# Patient Record
Sex: Male | Born: 1965 | Race: White | Hispanic: No | Marital: Single | Smoking: Former smoker
Health system: Southern US, Community
[De-identification: ages and names within clinical notes are randomized; demographics above are authoritative.]

## PROBLEM LIST (undated history)

## (undated) DIAGNOSIS — M1990 Unspecified osteoarthritis, unspecified site: Secondary | ICD-10-CM

## (undated) DIAGNOSIS — I10 Essential (primary) hypertension: Secondary | ICD-10-CM

## (undated) HISTORY — PX: EYE SURGERY: SHX253

## (undated) HISTORY — PX: VASECTOMY: SHX75

## (undated) HISTORY — PX: ANKLE FRACTURE SURGERY: SHX122

## (undated) HISTORY — PX: ORBITAL FRACTURE SURGERY: SHX725

## (undated) HISTORY — PX: FRACTURE SURGERY: SHX138

---

## 2013-10-11 ENCOUNTER — Emergency Department: Payer: Self-pay | Admitting: Emergency Medicine

## 2013-10-16 ENCOUNTER — Emergency Department: Payer: Self-pay | Admitting: Emergency Medicine

## 2013-10-20 ENCOUNTER — Emergency Department: Payer: Self-pay | Admitting: Emergency Medicine

## 2014-06-08 HISTORY — PX: FACIAL FRACTURE SURGERY: SHX1570
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
## 2019-08-09 NOTE — Anesthesia Preprocedure Evaluation (Addendum)
Anesthesia Evaluation  Patient identified by MRN, date of birth, ID band Patient awake    Reviewed: Allergy & Precautions, NPO status , Patient's Chart, lab work & pertinent test results  History of Anesthesia Complications Negative for: history of anesthetic complications  Airway Mallampati: II  TM Distance: >3 FB Neck ROM: Full  Mouth opening: Limited Mouth Opening  Dental   Pulmonary former smoker,    breath sounds clear to auscultation       Cardiovascular hypertension, (-) angina(-) DOE  Rhythm:Regular Rate:Normal     Neuro/Psych    GI/Hepatic neg GERD  ,  Endo/Other    Renal/GU      Musculoskeletal   Abdominal   Peds  Hematology   Anesthesia Other Findings   Reproductive/Obstetrics                            Anesthesia Physical Anesthesia Plan  ASA: II  Anesthesia Plan: MAC   Post-op Pain Management:    Induction: Intravenous  PONV Risk Score and Plan: 1 and TIVA, Midazolam and Treatment may vary due to age or medical condition  Airway Management Planned: Nasal Cannula  Additional Equipment:   Intra-op Plan:   Post-operative Plan:   Informed Consent: I have reviewed the patients History and Physical, chart, labs and discussed the procedure including the risks, benefits and alternatives for the proposed anesthesia with the patient or authorized representative who has indicated his/her understanding and acceptance.       Plan Discussed with: CRNA and Anesthesiologist  Anesthesia Plan Comments:         Anesthesia Quick Evaluation

## 2019-08-14 ENCOUNTER — Other Ambulatory Visit
Admission: RE | Admit: 2019-08-14 | Discharge: 2019-08-14 | Disposition: A | Discharge status: Home / Self Care | Payer: HRSA Program | Source: Ambulatory Visit | Attending: Ophthalmology | Admitting: Ophthalmology

## 2019-08-14 ENCOUNTER — Encounter: Payer: Self-pay | Admitting: Ophthalmology

## 2019-08-14 ENCOUNTER — Other Ambulatory Visit: Payer: Self-pay

## 2019-08-14 DIAGNOSIS — Z20822 Contact with and (suspected) exposure to covid-19: Secondary | ICD-10-CM | POA: Diagnosis not present

## 2019-08-14 DIAGNOSIS — Z01812 Encounter for preprocedural laboratory examination: Secondary | ICD-10-CM | POA: Diagnosis present

## 2019-08-14 LAB — SARS CORONAVIRUS 2 (TAT 6-24 HRS): SARS Coronavirus 2: NEGATIVE

## 2019-08-14 NOTE — Discharge Instructions (Signed)

## 2019-08-16 ENCOUNTER — Ambulatory Visit: Payer: Self-pay | Admitting: Anesthesiology

## 2019-08-16 ENCOUNTER — Encounter: Payer: Self-pay | Admitting: Ophthalmology

## 2019-08-16 ENCOUNTER — Ambulatory Visit
Admission: RE | Admit: 2019-08-16 | Discharge: 2019-08-16 | Disposition: A | Discharge status: Home / Self Care | Payer: Self-pay | Attending: Ophthalmology | Admitting: Ophthalmology

## 2019-08-16 ENCOUNTER — Encounter
Admission: RE | Disposition: A | Discharge status: Home / Self Care | Payer: Self-pay | Source: Home / Self Care | Attending: Ophthalmology

## 2019-08-16 ENCOUNTER — Other Ambulatory Visit: Payer: Self-pay

## 2019-08-16 DIAGNOSIS — H2511 Age-related nuclear cataract, right eye: Secondary | ICD-10-CM | POA: Insufficient documentation

## 2019-08-16 DIAGNOSIS — Z87891 Personal history of nicotine dependence: Secondary | ICD-10-CM | POA: Insufficient documentation

## 2019-08-16 DIAGNOSIS — I10 Essential (primary) hypertension: Secondary | ICD-10-CM | POA: Insufficient documentation

## 2019-08-16 HISTORY — DX: Essential (primary) hypertension: I10

## 2019-08-16 HISTORY — DX: Unspecified osteoarthritis, unspecified site: M19.90

## 2019-08-16 HISTORY — PX: CATARACT EXTRACTION W/PHACO: SHX586

## 2019-08-16 SURGERY — PHACOEMULSIFICATION, CATARACT, WITH IOL INSERTION
Anesthesia: Monitor Anesthesia Care | Site: Eye | Laterality: Right

## 2019-08-16 MED ORDER — MIDAZOLAM HCL 2 MG/2ML IJ SOLN
INTRAMUSCULAR | Status: DC | PRN
  Administered 2019-08-16 (×2): 1 mg via INTRAVENOUS

## 2019-08-16 MED ORDER — FENTANYL CITRATE (PF) 100 MCG/2ML IJ SOLN
INTRAMUSCULAR | Status: DC | PRN
  Administered 2019-08-16: 50 ug via INTRAVENOUS

## 2019-08-16 MED ORDER — ACETAMINOPHEN 10 MG/ML IV SOLN: 1000.0000 mg | Freq: Once | INTRAVENOUS | Status: DC | PRN

## 2019-08-16 MED ORDER — BRIMONIDINE TARTRATE-TIMOLOL 0.2-0.5 % OP SOLN
OPHTHALMIC | Status: DC | PRN
  Administered 2019-08-16: 1 [drp] via OPHTHALMIC

## 2019-08-16 MED ORDER — MOXIFLOXACIN HCL 0.5 % OP SOLN
1.0000 [drp] | OPHTHALMIC | Status: DC | PRN
  Administered 2019-08-16 (×3): 1 [drp] via OPHTHALMIC

## 2019-08-16 MED ORDER — ARMC OPHTHALMIC DILATING DROPS
1.0000 "application " | OPHTHALMIC | Status: DC | PRN
  Administered 2019-08-16 (×3): 1 via OPHTHALMIC

## 2019-08-16 MED ORDER — LIDOCAINE HCL (PF) 2 % IJ SOLN
INTRAOCULAR | Status: DC | PRN
  Administered 2019-08-16: 2 mL

## 2019-08-16 MED ORDER — ONDANSETRON HCL 4 MG/2ML IJ SOLN: 4.0000 mg | Freq: Once | INTRAMUSCULAR | Status: DC | PRN

## 2019-08-16 MED ORDER — EPINEPHRINE PF 1 MG/ML IJ SOLN
INTRAOCULAR | Status: DC | PRN
  Administered 2019-08-16: 10:00:00 43 mL via OPHTHALMIC

## 2019-08-16 MED ORDER — LACTATED RINGERS IV SOLN: 100.0000 mL/h | INTRAVENOUS | Status: DC

## 2019-08-16 MED ORDER — NA HYALUR & NA CHOND-NA HYALUR 0.4-0.35 ML IO KIT
PACK | INTRAOCULAR | Status: DC | PRN
  Administered 2019-08-16: 1 mL via INTRAOCULAR

## 2019-08-16 MED ORDER — TETRACAINE HCL 0.5 % OP SOLN
1.0000 [drp] | OPHTHALMIC | Status: DC | PRN
  Administered 2019-08-16 (×3): 1 [drp] via OPHTHALMIC

## 2019-08-16 MED ORDER — CEFUROXIME OPHTHALMIC INJECTION 1 MG/0.1 ML
INJECTION | OPHTHALMIC | Status: DC | PRN
  Administered 2019-08-16: 0.1 mL via INTRACAMERAL

## 2019-08-16 SURGICAL SUPPLY — 22 items
CANNULA ANT/CHMB 27G (MISCELLANEOUS) ×1 IMPLANT
CANNULA ANT/CHMB 27GA (MISCELLANEOUS) ×3 IMPLANT
GLOVE SURG LX 7.5 STRW (GLOVE) ×2
GLOVE SURG LX STRL 7.5 STRW (GLOVE) ×1 IMPLANT
GLOVE SURG TRIUMPH 8.0 PF LTX (GLOVE) ×3 IMPLANT
GOWN STRL REUS W/ TWL LRG LVL3 (GOWN DISPOSABLE) ×2 IMPLANT
GOWN STRL REUS W/TWL LRG LVL3 (GOWN DISPOSABLE) ×4
LENS IOL TECNIS ITEC 21.5 (Intraocular Lens) ×2 IMPLANT
MARKER SKIN DUAL TIP RULER LAB (MISCELLANEOUS) ×3 IMPLANT
NDL CAPSULORHEX 25GA (NEEDLE) ×1 IMPLANT
NDL FILTER BLUNT 18X1 1/2 (NEEDLE) ×2 IMPLANT
NEEDLE CAPSULORHEX 25GA (NEEDLE) ×3 IMPLANT
NEEDLE FILTER BLUNT 18X 1/2SAF (NEEDLE) ×4
NEEDLE FILTER BLUNT 18X1 1/2 (NEEDLE) ×2 IMPLANT
PACK CATARACT BRASINGTON (MISCELLANEOUS) ×3 IMPLANT
PACK EYE AFTER SURG (MISCELLANEOUS) ×3 IMPLANT
PACK OPTHALMIC (MISCELLANEOUS) ×3 IMPLANT
SOLUTION OPHTHALMIC SALT (MISCELLANEOUS) ×3 IMPLANT
SYR 3ML LL SCALE MARK (SYRINGE) ×6 IMPLANT
SYR TB 1ML LUER SLIP (SYRINGE) ×3 IMPLANT
WATER STERILE IRR 250ML POUR (IV SOLUTION) ×3 IMPLANT
WIPE NON LINTING 3.25X3.25 (MISCELLANEOUS) ×3 IMPLANT

## 2019-08-16 NOTE — Transfer of Care (Signed)
Immediate Anesthesia Transfer of Care Note  Patient: Rick Aguilar  Procedure(s) Performed: CATARACT EXTRACTION PHACO AND INTRAOCULAR LENS PLACEMENT (IOC) RIGHT 2.52 00:24.0 10.5% (Right Eye)  Patient Location: PACU  Anesthesia Type: MAC  Level of Consciousness: awake, alert  and patient cooperative  Airway and Oxygen Therapy: Patient Spontanous Breathing and Patient connected to supplemental oxygen  Post-op Assessment: Post-op Vital signs reviewed, Patient's Cardiovascular Status Stable, Respiratory Function Stable, Patent Airway and No signs of Nausea or vomiting  Post-op Vital Signs: Reviewed and stable  Complications: No apparent anesthesia complications

## 2019-08-16 NOTE — Anesthesia Postprocedure Evaluation (Signed)
Anesthesia Post Note  Patient: Rick Aguilar  Procedure(s) Performed: CATARACT EXTRACTION PHACO AND INTRAOCULAR LENS PLACEMENT (IOC) RIGHT 2.52 00:24.0 10.5% (Right Eye)     Patient location during evaluation: PACU Anesthesia Type: MAC Level of consciousness: awake and alert Pain management: pain level controlled Vital Signs Assessment: post-procedure vital signs reviewed and stable Respiratory status: spontaneous breathing, nonlabored ventilation, respiratory function stable and patient connected to nasal cannula oxygen Cardiovascular status: stable and blood pressure returned to baseline Postop Assessment: no apparent nausea or vomiting Anesthetic complications: no    Baylynn Shifflett A  Romey Mathieson

## 2019-08-16 NOTE — Op Note (Signed)
LOCATION:  [place]   PREOPERATIVE DIAGNOSIS:    Nuclear sclerotic cataract right eye. H25.11   POSTOPERATIVE DIAGNOSIS:  Nuclear sclerotic cataract right eye.     PROCEDURE:  Phacoemusification with posterior chamber intraocular lens placement of the right eye   ULTRASOUND TIME: Procedure(s): CATARACT EXTRACTION PHACO AND INTRAOCULAR LENS PLACEMENT (IOC) RIGHT 2.52 00:24.0 10.5% (Right)  LENS:   Implant Name Type Inv. Item Serial No. Manufacturer Lot No. LRB No. Used Action  LENS IOL DIOP 21.5 - [id number] Intraocular Lens LENS IOL DIOP 21.5 [id number]  Right 1 Implanted         SURGEON:  Wyonia Hough, MD   ANESTHESIA:  Topical with tetracaine drops and 2% Xylocaine jelly, augmented with 1% preservative-free intracameral lidocaine.    COMPLICATIONS:  None.   DESCRIPTION OF PROCEDURE:  The patient was identified in the holding room and transported to the operating room and placed in the supine position under the operating microscope.  The right eye was identified as the operative eye and it was prepped and draped in the usual sterile ophthalmic fashion.   A 1 millimeter clear-corneal paracentesis was made at the 12:00 position.  0.5 ml of preservative-free 1% lidocaine was injected into the anterior chamber. The anterior chamber was filled with Viscoat viscoelastic.  A 2.4 millimeter keratome was used to make a near-clear corneal incision at the 9:00 position.  A curvilinear capsulorrhexis was made with a cystotome and capsulorrhexis forceps.  Balanced salt solution was used to hydrodissect and hydrodelineate the nucleus.   Phacoemulsification was then used in stop and chop fashion to remove the lens nucleus and epinucleus.  The remaining cortex was then removed using the irrigation and aspiration handpiece. Provisc was then placed into the capsular bag to distend it for lens placement.  A lens was then injected into the capsular bag.  The remaining  viscoelastic was aspirated.   Wounds were hydrated with balanced salt solution.  The anterior chamber was inflated to a physiologic pressure with balanced salt solution.  No wound leaks were noted. Cefuroxime 0.1 ml of a 10mg /ml solution was injected into the anterior chamber for a dose of 1 mg of intracameral antibiotic at the completion of the case.   Timolol and Brimonidine drops were applied to the eye.  The patient was taken to the recovery room in stable condition without complications of anesthesia or surgery.   Leili Eskenazi 08/16/2019, 10:24 AM

## 2019-08-16 NOTE — H&P (Signed)

## 2019-08-16 NOTE — Anesthesia Procedure Notes (Signed)
Procedure Name: MAC Date/Time: 08/16/2019 10:04 AM Performed by: Cameron Ali, CRNA Pre-anesthesia Checklist: Patient identified, Emergency Drugs available, Suction available, Timeout performed and Patient being monitored Patient Re-evaluated:Patient Re-evaluated prior to induction Oxygen Delivery Method: Nasal cannula Placement Confirmation: positive ETCO2

## 2019-08-17 ENCOUNTER — Encounter: Payer: Self-pay | Admitting: *Deleted

## 2021-06-11 ENCOUNTER — Encounter: Payer: Self-pay | Admitting: *Deleted

## 2021-10-07 ENCOUNTER — Emergency Department
Admission: EM | Admit: 2021-10-07 | Discharge: 2021-10-07 | Disposition: A | Discharge status: Home / Self Care | Payer: 59 | Attending: Emergency Medicine | Admitting: Emergency Medicine

## 2021-10-07 ENCOUNTER — Encounter: Payer: Self-pay | Admitting: Emergency Medicine

## 2021-10-07 ENCOUNTER — Other Ambulatory Visit: Payer: Self-pay

## 2021-10-07 DIAGNOSIS — Z4800 Encounter for change or removal of nonsurgical wound dressing: Secondary | ICD-10-CM | POA: Insufficient documentation

## 2021-10-07 DIAGNOSIS — Z09 Encounter for follow-up examination after completed treatment for conditions other than malignant neoplasm: Secondary | ICD-10-CM

## 2021-10-07 DIAGNOSIS — I10 Essential (primary) hypertension: Secondary | ICD-10-CM | POA: Insufficient documentation

## 2021-10-07 MED ORDER — CEPHALEXIN 500 MG PO CAPS: 500.0000 mg | ORAL_CAPSULE | Freq: Four times a day (QID) | ORAL | 0 refills | Status: AC

## 2021-10-07 NOTE — ED Triage Notes (Signed)
Pt here with a wound check. Pt had surgery done at [facility] last week for a back abcess but was told that his insurance does not cover his follow up visit. Pt has a tube with stitching taped to his back. Pt denies N/V/D. ?

## 2021-10-07 NOTE — Discharge Instructions (Addendum)
-  Follow-up with your primary care provider/general surgeon as needed ? ?-Keep the opening of the incision sites covered with Band-Aids or sterile dressing for the next 1 to 2 weeks.  Recommend topical antibiotic ointment daily.  Continue to wash daily with soap and water ? ?-You may begin taking the antibiotics if you begin to experience any fever/chills or notice significant redness around the incision sites and/or purulent drainage. ?

## 2021-10-07 NOTE — ED Provider Notes (Signed)
? ?[facility] ?Provider Note ? ? ? Event Date/Time  ? First MD Initiated Contact with Patient 10/07/21 1110   ?  (approximate) ? ? ?History  ? ?Chief Complaint ?Wound Check ? ? ?HPI ?Rick Aguilar is a 56 y.o. male, history of hypertension, arthritis, presents emergency department for wound check.  Patient states that he recently had an large back abscess drained at [facility] last week.  They inserted a loop drainage tube.  He states that he is supposed to have an appointment today to get it removed, however was told that his insurance would not cover the follow-up visit because it was not in network.  He presents here today to have this loop drain removed.  Denies any complications throughout the recovery.  Denies fever/chills, chest pain, shortness of breath, abdominal pain, flank pain, nausea/vomiting, diarrhea, urinary symptoms, headache, rash/lesions, or dizziness/lightheadedness. ? ?History Limitations: No limitations. ? ?    ? ? ?Physical Exam  ?Triage Vital Signs: ?ED Triage Vitals [10/07/21 1105]  ?Enc Vitals Group  ?   BP 119/80  ?   Pulse Rate (!) 56  ?   Resp 16  ?   Temp 97.9 ?F (36.6 ?C)  ?   Temp Source Oral  ?   SpO2 95 %  ?   Weight 180 lb (81.6 kg)  ?   Height '5\' 7"'$  (1.702 m)  ?   Head Circumference   ?   Peak Flow   ?   Pain Score 0  ?   Pain Loc   ?   Pain Edu?   ?   Excl. in [place]?   ? ? ?Most recent vital signs: ?Vitals:  ? 10/07/21 1105  ?BP: 119/80  ?Pulse: (!) 56  ?Resp: 16  ?Temp: 97.9 ?F (36.6 ?C)  ?SpO2: 95%  ? ? ?General: Awake, NAD.  ?Skin: Warm, dry. No rashes or lesions.  ?Eyes: PERRL. Conjunctivae normal.  ?CV: Good peripheral perfusion.  ?Resp: Normal effort.  ?Abd: Soft, non-tender. No distention.  ?Neuro: At baseline. No gross neurological deficits.  ? ?Focused Exam: Loop drain noted along the middle of the patient's back, just lateral to the spinous process, covering a 4 cm diameter.  Appears to be healing well.  No surrounding erythema or tenderness.  No fluctuant  masses.  No active bleeding or discharge. ? ?Physical Exam ? ? ? ?ED Results / Procedures / Treatments  ?Labs ?(all labs ordered are listed, but only abnormal results are displayed) ?Labs Reviewed - No data to display ? ? ?EKG ?NA ? ? ?RADIOLOGY ? ?ED Provider Interpretation: N/A. ? ?No results found. ? ?PROCEDURES: ? ?Critical Care performed: N/A ? ?Procedures ? ? ? ?MEDICATIONS ORDERED IN ED: ?Medications - No data to display ? ? ?IMPRESSION / MDM / ASSESSMENT AND PLAN / ED COURSE  ?I reviewed the triage vital signs and the nursing notes. ?             ?               ? ?Differential diagnosis includes, but is not limited to, abscess, cellulitis.  ? ?ED Course ?Patient appears well, vitals within normal limits.  Currently afebrile. ? ?Loop drain was removed without any immediate complications ? ?Assessment/Plan ?Patient presents for loop drainage removal following incision and drainage of large back abscess.  It appears to be recovering well.  Remove the loop drain without any immediate complications.  Flushed with 50 cc of normal saline.  Covered with adhesive bandages.  No signs of infection at this time, however will provide patient with a prescription for cephalexin to be taken if he begins to experience any fever/chills, erythema around the borders, or purulent discharge.  Provided him with instructions for home care.  For further work-up or treatment indicated at this time.  We will discharge ? ?Provided the patient with anticipatory guidance, return precautions, and educational material. Encouraged the patient to return to the emergency department at any time if they begin to experience any new or worsening symptoms. Patient expressed understanding and agreed with the plan.  ? ?  ? ? ?FINAL CLINICAL IMPRESSION(S) / ED DIAGNOSES  ? ?Final diagnoses:  ?Encounter for recheck of abscess following incision and drainage  ? ? ? ?Rx / DC Orders  ? ?ED Discharge Orders   ? ?      Ordered  ?  cephALEXin (KEFLEX) 500  MG capsule  4 times daily       ? 10/07/21 1225  ? ?  ?  ? ?  ? ? ? ?Note:  This document was prepared using Dragon voice recognition software and may include unintentional dictation errors. ?  ?Teodoro Spray, [place] ?10/07/21 1237 ? ?  ?Carrie Mew, MD ?10/07/21 1412 ? ?

## 2022-04-02 NOTE — H&P (Signed)
Pre-Procedure H&P   Patient ID: Rick Aguilar is a 56 y.o. male.  Gastroenterology Provider: Annamaria Helling, DO  Referring Provider: Dr. Ola Aguilar PCP: Rick Aguilar  Date: 04/03/2022  HPI [name] is a 56 y.o. male who presents today for Colonoscopy for initial screening colonoscopy.  Patient's BMs have been regular without melena hematochezia diarrhea or constipation.  He has no family history of colon cancer.  His brother was noted to have polyps  Most recent lab work hemoglobin 15.4 MCV 89.5 platelets 255,000 creatinine 0.9   Past Medical History:  Diagnosis Date   Arthritis    Hypertension     Past Surgical History:  Procedure Laterality Date   ANKLE FRACTURE SURGERY Right    CATARACT EXTRACTION W/PHACO Right 08/16/2019   Procedure: CATARACT EXTRACTION PHACO AND INTRAOCULAR LENS PLACEMENT (IOC) RIGHT 2.52 00:24.0 10.5%;  Surgeon: Leandrew Koyanagi, MD;  Location: [place];  Service: Ophthalmology;  Laterality: Right;   EYE SURGERY     FACIAL FRACTURE SURGERY  2016   FRACTURE SURGERY     ORBITAL FRACTURE SURGERY     VASECTOMY      Family History Brother- colon polyps No other h/o GI disease or malignancy  Review of Systems  Constitutional:  Negative for activity change, appetite change, chills, diaphoresis, fatigue, fever and unexpected weight change.  HENT:  Negative for trouble swallowing and voice change.   Respiratory:  Negative for shortness of breath and wheezing.   Cardiovascular:  Negative for chest pain, palpitations and leg swelling.  Gastrointestinal:  Negative for abdominal distention, abdominal pain, anal bleeding, blood in stool, constipation, diarrhea, nausea and vomiting.  Musculoskeletal:  Negative for arthralgias and myalgias.  Skin:  Negative for color change and pallor.  Neurological:  Negative for dizziness, syncope and weakness.  Psychiatric/Behavioral:  Negative for confusion. The patient is not  nervous/anxious.   All other systems reviewed and are negative.    Medications No current facility-administered medications on file prior to encounter.   Current Outpatient Medications on File Prior to Encounter  Medication Sig Dispense Refill   Ascorbic Acid (VITAMIN C PO) Take by mouth daily.     COLLAGEN PO Take by mouth daily.     Cyanocobalamin (VITAMIN B-12 PO) Take by mouth daily.     lisinopril-hydrochlorothiazide (ZESTORETIC) 20-25 MG tablet Take 1 tablet by mouth daily.     Omega-3 Fatty Acids (FISH OIL PO) Take by mouth daily.     UNABLE TO FIND Testosterone supplement daily.     UNABLE TO FIND Force factor prostate     VITAMIN D PO Take by mouth daily.     VITAMIN E PO Take by mouth daily.     WHEY PROTEIN PO Take by mouth daily.      Pertinent medications related to GI and procedure were reviewed by me with the patient prior to the procedure   Current Facility-Administered Medications:    0.9 %  sodium chloride infusion, , Intravenous, Continuous, Annamaria Helling, DO, Last Rate: 20 mL/hr at 04/03/22 1032, 1,000 mL at 04/03/22 1032      No Known Allergies Allergies were reviewed by me prior to the procedure  Objective   Body mass index is 31.62 kg/m. Vitals:   04/03/22 1044  BP: 115/76  Pulse: 61  Resp: 18  Temp: 97.7 F (36.5 C)  TempSrc: Temporal  SpO2: 100%  Weight: 86.2 kg  Height: '5\' 5"'$  (1.651 m)     Physical  Exam Vitals and nursing note reviewed.  Constitutional:      General: He is not in acute distress.    Appearance: Normal appearance. He is not ill-appearing, toxic-appearing or diaphoretic.  HENT:     Head: Normocephalic and atraumatic.     Nose: Nose normal.     Mouth/Throat:     Mouth: Mucous membranes are moist.     Pharynx: Oropharynx is clear.  Eyes:     General: No scleral icterus.    Extraocular Movements: Extraocular movements intact.  Cardiovascular:     Rate and Rhythm: Normal rate and regular rhythm.     Heart  sounds: Normal heart sounds. No murmur heard.    No friction rub. No gallop.  Pulmonary:     Effort: Pulmonary effort is normal. No respiratory distress.     Breath sounds: Normal breath sounds. No wheezing, rhonchi or rales.  Abdominal:     General: Bowel sounds are normal. There is no distension.     Palpations: Abdomen is soft.     Tenderness: There is no abdominal tenderness. There is no guarding or rebound.  Musculoskeletal:     Cervical back: Neck supple.     Right lower leg: No edema.     Left lower leg: No edema.  Skin:    General: Skin is warm and dry.     Coloration: Skin is not jaundiced or pale.  Neurological:     General: No focal deficit present.     Mental Status: He is alert and oriented to person, place, and time. Mental status is at baseline.  Psychiatric:        Mood and Affect: Mood normal.        Behavior: Behavior normal.        Thought Content: Thought content normal.        Judgment: Judgment normal.      Assessment:  [name] is a 56 y.o. male  who presents today for Colonoscopy for initial screening colonoscopy.  Plan:  Colonoscopy with possible intervention today  Colonoscopy with possible biopsy, control of bleeding, polypectomy, and interventions as necessary has been discussed with the patient/patient representative. Informed consent was obtained from the patient/patient representative after explaining the indication, nature, and risks of the procedure including but not limited to death, bleeding, perforation, missed neoplasm/lesions, cardiorespiratory compromise, and reaction to medications. Opportunity for questions was given and appropriate answers were provided. Patient/patient representative has verbalized understanding is amenable to undergoing the procedure.   Annamaria Helling, DO  [facility] Gastroenterology  Portions of the record may have been created with voice recognition software. Occasional wrong-word or  'sound-a-like' substitutions may have occurred due to the inherent limitations of voice recognition software.  Read the chart carefully and recognize, using context, where substitutions may have occurred.

## 2022-04-03 ENCOUNTER — Ambulatory Visit: Payer: Commercial Managed Care - HMO | Admitting: Anesthesiology

## 2022-04-03 ENCOUNTER — Encounter
Admission: RE | Disposition: A | Discharge status: Home / Self Care | Payer: Self-pay | Source: Home / Self Care | Attending: Gastroenterology

## 2022-04-03 ENCOUNTER — Ambulatory Visit
Admission: RE | Admit: 2022-04-03 | Discharge: 2022-04-03 | Disposition: A | Discharge status: Home / Self Care | Payer: Commercial Managed Care - HMO | Attending: Gastroenterology | Admitting: Gastroenterology

## 2022-04-03 ENCOUNTER — Encounter: Payer: Self-pay | Admitting: Gastroenterology

## 2022-04-03 DIAGNOSIS — Z87891 Personal history of nicotine dependence: Secondary | ICD-10-CM | POA: Diagnosis not present

## 2022-04-03 DIAGNOSIS — Z1211 Encounter for screening for malignant neoplasm of colon: Secondary | ICD-10-CM | POA: Insufficient documentation

## 2022-04-03 DIAGNOSIS — D123 Benign neoplasm of transverse colon: Secondary | ICD-10-CM | POA: Insufficient documentation

## 2022-04-03 DIAGNOSIS — D122 Benign neoplasm of ascending colon: Secondary | ICD-10-CM | POA: Diagnosis not present

## 2022-04-03 DIAGNOSIS — K5730 Diverticulosis of large intestine without perforation or abscess without bleeding: Secondary | ICD-10-CM | POA: Diagnosis not present

## 2022-04-03 DIAGNOSIS — I10 Essential (primary) hypertension: Secondary | ICD-10-CM | POA: Diagnosis not present

## 2022-04-03 DIAGNOSIS — D128 Benign neoplasm of rectum: Secondary | ICD-10-CM | POA: Insufficient documentation

## 2022-04-03 DIAGNOSIS — Z9852 Vasectomy status: Secondary | ICD-10-CM | POA: Insufficient documentation

## 2022-04-03 DIAGNOSIS — D124 Benign neoplasm of descending colon: Secondary | ICD-10-CM | POA: Diagnosis not present

## 2022-04-03 DIAGNOSIS — K640 First degree hemorrhoids: Secondary | ICD-10-CM | POA: Diagnosis not present

## 2022-04-03 DIAGNOSIS — Z83711 Family history of hyperplastic colon polyps: Secondary | ICD-10-CM | POA: Diagnosis not present

## 2022-04-03 HISTORY — PX: COLONOSCOPY: SHX5424

## 2022-04-03 SURGERY — COLONOSCOPY
Anesthesia: General

## 2022-04-03 MED ORDER — PHENYLEPHRINE 80 MCG/ML (10ML) SYRINGE FOR IV PUSH (FOR BLOOD PRESSURE SUPPORT)
PREFILLED_SYRINGE | INTRAVENOUS | Status: DC | PRN
  Administered 2022-04-03 (×2): 80 ug via INTRAVENOUS

## 2022-04-03 MED ORDER — PROPOFOL 10 MG/ML IV BOLUS
INTRAVENOUS | Status: DC | PRN
  Administered 2022-04-03 (×3): 40 mg via INTRAVENOUS
  Administered 2022-04-03 (×4): 50 mg via INTRAVENOUS
  Administered 2022-04-03: 130 mg via INTRAVENOUS

## 2022-04-03 MED ORDER — SODIUM CHLORIDE 0.9 % IV SOLN
INTRAVENOUS | Status: DC
  Administered 2022-04-03: 1000 mL via INTRAVENOUS

## 2022-04-03 NOTE — Interval H&P Note (Signed)
History and Physical Interval Note: Preprocedure H&P from 04/03/22  was reviewed and there was no interval change after seeing and examining the patient.  Written consent was obtained from the patient after discussion of risks, benefits, and alternatives. Patient has consented to proceed with Colonoscopy with possible intervention   04/03/2022 10:48 AM  Rick Aguilar  has presented today for surgery, with the diagnosis of Colon cancer screening (Z12.11) Family history of colonic polyps (Z83.71).  The various methods of treatment have been discussed with the patient and family. After consideration of risks, benefits and other options for treatment, the patient has consented to  Procedure(s): COLONOSCOPY (N/A) as a surgical intervention.  The patient's history has been reviewed, patient examined, no change in status, stable for surgery.  I have reviewed the patient's chart and labs.  Questions were answered to the patient's satisfaction.     Annamaria Helling

## 2022-04-03 NOTE — Op Note (Signed)
[facility] Gastroenterology Patient Name: Rick Aguilar Procedure Date: 04/03/2022 10:49 AM MRN: [id number] Account #: [id number] Date of Birth: 10-14-1965 Admit Type: Outpatient Age: 56 Room: [facility] ENDO ROOM 1 Gender: Male Note Status: Supervisor Override Instrument Name: Jasper Riling [id number] Procedure:             Colonoscopy Indications:           Screening for colorectal malignant neoplasm, Screening                         for colon cancer: Family history of colon polyps. Providers:             Annamaria Helling DO, DO Referring MD:          No Local Md, MD (Referring MD) Medicines:             Monitored Anesthesia Care Complications:         No immediate complications. Estimated blood loss:                         Minimal. Procedure:             Pre-Anesthesia Assessment:                        - Prior to the procedure, a History and Physical was                         performed, and patient medications and allergies were                         reviewed. The patient is competent. The risks and                         benefits of the procedure and the sedation options and                         risks were discussed with the patient. All questions                         were answered and informed consent was obtained.                         Patient identification and proposed procedure were                         verified by the physician, the nurse, the anesthetist                         and the technician in the endoscopy suite. Mental                         Status Examination: alert and oriented. Airway                         Examination: normal oropharyngeal airway and neck                         mobility. Respiratory Examination: clear to  auscultation. CV Examination: RRR, no murmurs, no S3                         or S4. Prophylactic Antibiotics: The patient does not                         require prophylactic  antibiotics. Prior                         Anticoagulants: The patient has taken no anticoagulant                         or antiplatelet agents. ASA Grade Assessment: II - A                         patient with mild systemic disease. After reviewing                         the risks and benefits, the patient was deemed in                         satisfactory condition to undergo the procedure. The                         anesthesia plan was to use monitored anesthesia care                         (MAC). Immediately prior to administration of                         medications, the patient was re-assessed for adequacy                         to receive sedatives. The heart rate, respiratory                         rate, oxygen saturations, blood pressure, adequacy of                         pulmonary ventilation, and response to care were                         monitored throughout the procedure. The physical                         status of the patient was re-assessed after the                         procedure.                        After obtaining informed consent, the colonoscope was                         passed under direct vision. Throughout the procedure,                         the patient's blood pressure, pulse, and oxygen  saturations were monitored continuously. The                         Colonoscope was introduced through the anus and                         advanced to the the terminal ileum, with                         identification of the appendiceal orifice and IC                         valve. The colonoscopy was performed without                         difficulty. The patient tolerated the procedure well.                         The quality of the bowel preparation was evaluated                         using the BBPS [facility] Bowel Preparation Scale) with                         scores of: Right Colon = 2 (minor amount of residual                          staining, small fragments of stool and/or opaque                         liquid, but mucosa seen well), Transverse Colon = 2                         (minor amount of residual staining, small fragments of                         stool and/or opaque liquid, but mucosa seen well) and                         Left Colon = 3 (entire mucosa seen well with no                         residual staining, small fragments of stool or opaque                         liquid). The total BBPS score equals 7. The quality of                         the bowel preparation was good. The terminal ileum,                         ileocecal valve, appendiceal orifice, and rectum were                         photographed. Findings:      The perianal and digital rectal examinations were normal. Pertinent       negatives include normal sphincter  tone.      The terminal ileum appeared normal. Estimated blood loss: none.      Multiple small-mouthed diverticula were found in the recto-sigmoid colon       and sigmoid colon. Estimated blood loss: none.      Non-bleeding internal hemorrhoids were found during retroflexion. The       hemorrhoids were Grade I (internal hemorrhoids that do not prolapse).       Estimated blood loss: none.      Three sessile polyps were found in the descending colon, transverse       colon and ascending colon. The polyps were 3 to 4 mm in size. These       polyps were removed with a cold snare. Resection and retrieval were       complete. Estimated blood loss was minimal.      Three sessile polyps were found in the rectum, transverse colon and       ascending colon. The polyps were 1 to 2 mm in size. These polyps were       removed with a jumbo cold forceps. Resection and retrieval were       complete. Estimated blood loss was minimal.      Retroflexion in the right colon was performed.      The exam was otherwise without abnormality on direct and retroflexion        views. Impression:            - The examined portion of the ileum was normal.                        - Diverticulosis in the recto-sigmoid colon and in the                         sigmoid colon.                        - Non-bleeding internal hemorrhoids.                        - Three 3 to 4 mm polyps in the descending colon, in                         the transverse colon and in the ascending colon,                         removed with a cold snare. Resected and retrieved.                        - Three 1 to 2 mm polyps in the rectum, in the                         transverse colon and in the ascending colon, removed                         with a jumbo cold forceps. Resected and retrieved.                        - The examination was otherwise normal on direct and  retroflexion views. Recommendation:        - Patient has a contact number available for                         emergencies. The signs and symptoms of potential                         delayed complications were discussed with the patient.                         Return to normal activities tomorrow. Written                         discharge instructions were provided to the patient.                        - Discharge patient to home.                        - Resume previous diet.                        - Continue present medications.                        - No aspirin, ibuprofen, naproxen, or other                         non-steroidal anti-inflammatory drugs for 5 days after                         polyp removal.                        - Await pathology results.                        - Repeat colonoscopy for surveillance based on                         pathology results.                        - Return to referring physician as previously                         scheduled.                        - The findings and recommendations were discussed with                         the patient. Procedure  Code(s):     --- Professional ---                        [phone], Colonoscopy, flexible; with removal of                         tumor(s), polyp(s), or other lesion(s) by snare                         technique  21194, 29, Colonoscopy, flexible; with biopsy, single                         or multiple Diagnosis Code(s):     --- Professional ---                        Z12.11, Encounter for screening for malignant neoplasm                         of colon                        K64.0, First degree hemorrhoids                        D12.4, Benign neoplasm of descending colon                        D12.8, Benign neoplasm of rectum                        D12.3, Benign neoplasm of transverse colon (hepatic                         flexure or splenic flexure)                        D12.2, Benign neoplasm of ascending colon                        K57.30, Diverticulosis of large intestine without                         perforation or abscess without bleeding CPT copyright 2022 [employer]. All rights reserved. The codes documented in this report are preliminary and upon coder review may  be revised to meet current compliance requirements. Attending Participation:      I personally performed the entire procedure. Volney American, DO Annamaria Helling DO, DO 04/03/2022 11:30:30 AM This report has been signed electronically. Number of Addenda: 0 Note Initiated On: 04/03/2022 10:49 AM Scope Withdrawal Time: 0 hours 26 minutes 38 seconds  Total Procedure Duration: 0 hours 28 minutes 45 seconds  Estimated Blood Loss:  Estimated blood loss was minimal.      [facility]

## 2022-04-03 NOTE — Anesthesia Preprocedure Evaluation (Signed)
Anesthesia Evaluation  Patient identified by MRN, date of birth, ID band Patient awake    Reviewed: Allergy & Precautions, NPO status , Patient's Chart, lab work & pertinent test results  History of Anesthesia Complications Negative for: history of anesthetic complications  Airway Mallampati: III  TM Distance: <3 FB Neck ROM: full    Dental  (+) Chipped   Pulmonary neg shortness of breath, former smoker,    Pulmonary exam normal        Cardiovascular Exercise Tolerance: Good hypertension, (-) anginaNormal cardiovascular exam     Neuro/Psych negative neurological ROS  negative psych ROS   GI/Hepatic negative GI ROS, Neg liver ROS, neg GERD  ,  Endo/Other  negative endocrine ROS  Renal/GU negative Renal ROS  negative genitourinary   Musculoskeletal   Abdominal   Peds  Hematology negative hematology ROS (+)   Anesthesia Other Findings Past Medical History: No date: Arthritis No date: Hypertension  Past Surgical History: No date: ANKLE FRACTURE SURGERY; Right 08/16/2019: CATARACT EXTRACTION W/PHACO; Right     Comment:  Procedure: CATARACT EXTRACTION PHACO AND INTRAOCULAR               LENS PLACEMENT (IOC) RIGHT 2.52 00:24.0 10.5%;  Surgeon:               Leandrew Koyanagi, MD;  Location: [facility];              Service: Ophthalmology;  Laterality: Right; No date: EYE SURGERY 2016: FACIAL FRACTURE SURGERY No date: FRACTURE SURGERY No date: ORBITAL FRACTURE SURGERY No date: VASECTOMY  BMI    Body Mass Index: 31.62 kg/m      Reproductive/Obstetrics negative OB ROS                             Anesthesia Physical Anesthesia Plan  ASA: 2  Anesthesia Plan: General   Post-op Pain Management:    Induction: Intravenous  PONV Risk Score and Plan: Propofol infusion and TIVA  Airway Management Planned: Natural Airway and Nasal Cannula  Additional Equipment:    Intra-op Plan:   Post-operative Plan:   Informed Consent: I have reviewed the patients History and Physical, chart, labs and discussed the procedure including the risks, benefits and alternatives for the proposed anesthesia with the patient or authorized representative who has indicated his/her understanding and acceptance.     Dental Advisory Given  Plan Discussed with: Anesthesiologist, CRNA and Surgeon  Anesthesia Plan Comments: (Patient consented for risks of anesthesia including but not limited to:  - adverse reactions to medications - risk of airway placement if required - damage to eyes, teeth, lips or other oral mucosa - nerve damage due to positioning  - sore throat or hoarseness - Damage to heart, brain, nerves, lungs, other parts of body or loss of life  Patient voiced understanding.)        Anesthesia Quick Evaluation

## 2022-04-03 NOTE — Anesthesia Postprocedure Evaluation (Signed)
Anesthesia Post Note  Patient: Rick Aguilar  Procedure(s) Performed: COLONOSCOPY  Patient location during evaluation: Endoscopy Anesthesia Type: General Level of consciousness: awake and alert Pain management: pain level controlled Vital Signs Assessment: post-procedure vital signs reviewed and stable Respiratory status: spontaneous breathing, nonlabored ventilation, respiratory function stable and patient connected to nasal cannula oxygen Cardiovascular status: blood pressure returned to baseline and stable Postop Assessment: no apparent nausea or vomiting Anesthetic complications: no   No notable events documented.   Last Vitals:  Vitals:   04/03/22 1138 04/03/22 1148  BP: 116/83 (!) 110/94  Pulse: (!) 52 (!) 53  Resp: (!) 9 17  Temp:    SpO2: 100% 100%    Last Pain:  Vitals:   04/03/22 1148  TempSrc:   PainSc: 0-No pain                 Precious Haws Ariatna Jester

## 2022-04-03 NOTE — Transfer of Care (Signed)
Immediate Anesthesia Transfer of Care Note  Patient: Rick Aguilar  Procedure(s) Performed: COLONOSCOPY  Patient Location: Endoscopy Unit  Anesthesia Type:General  Level of Consciousness: awake, alert  and oriented  Airway & Oxygen Therapy: Patient Spontanous Breathing  Post-op Assessment: Report given to RN, Post -op Vital signs reviewed and stable and Patient moving all extremities  Post vital signs: Reviewed and stable  Last Vitals:  Vitals Value Taken Time  BP    Temp    Pulse 60 04/03/22 1127  Resp 13 04/03/22 1127  SpO2 100 % 04/03/22 1127  Vitals shown include unvalidated device data.  Last Pain:  Vitals:   04/03/22 1044  TempSrc: Temporal  PainSc: 0-No pain         Complications: No notable events documented.

## 2022-04-06 LAB — SURGICAL PATHOLOGY

## 2022-08-20 ENCOUNTER — Other Ambulatory Visit: Payer: Self-pay | Admitting: Student

## 2022-08-20 DIAGNOSIS — S46011A Strain of muscle(s) and tendon(s) of the rotator cuff of right shoulder, initial encounter: Secondary | ICD-10-CM

## 2022-08-20 DIAGNOSIS — M7531 Calcific tendinitis of right shoulder: Secondary | ICD-10-CM

## 2022-08-20 DIAGNOSIS — M7581 Other shoulder lesions, right shoulder: Secondary | ICD-10-CM

## 2022-08-20 DIAGNOSIS — M958 Other specified acquired deformities of musculoskeletal system: Secondary | ICD-10-CM

## 2022-08-29 ENCOUNTER — Ambulatory Visit
Admission: RE | Admit: 2022-08-29 | Discharge: 2022-08-29 | Disposition: A | Discharge status: Home / Self Care | Payer: Commercial Managed Care - HMO | Source: Ambulatory Visit | Attending: Student | Admitting: Student

## 2022-08-29 DIAGNOSIS — M958 Other specified acquired deformities of musculoskeletal system: Secondary | ICD-10-CM

## 2022-08-29 DIAGNOSIS — S46011A Strain of muscle(s) and tendon(s) of the rotator cuff of right shoulder, initial encounter: Secondary | ICD-10-CM

## 2022-08-29 DIAGNOSIS — M7581 Other shoulder lesions, right shoulder: Secondary | ICD-10-CM

## 2022-08-29 DIAGNOSIS — M7531 Calcific tendinitis of right shoulder: Secondary | ICD-10-CM
